# Patient Record
Sex: Male | Born: 2000 | Race: White | Hispanic: No | Marital: Single | Smoking: Never smoker
Health system: Southern US, Community
[De-identification: ages and names within clinical notes are randomized; demographics above are authoritative.]

---
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
## 2019-04-14 ENCOUNTER — Other Ambulatory Visit: Payer: Self-pay

## 2019-04-14 DIAGNOSIS — Z20822 Contact with and (suspected) exposure to covid-19: Secondary | ICD-10-CM

## 2019-04-15 LAB — NOVEL CORONAVIRUS, NAA: SARS-CoV-2, NAA: DETECTED — AB

## 2019-06-27 ENCOUNTER — Ambulatory Visit
Admission: RE | Admit: 2019-06-27 | Discharge: 2019-06-27 | Disposition: A | Discharge status: Home / Self Care | Payer: PRIVATE HEALTH INSURANCE | Source: Ambulatory Visit | Attending: Sports Medicine | Admitting: Sports Medicine

## 2019-06-27 ENCOUNTER — Other Ambulatory Visit: Payer: Self-pay | Admitting: Sports Medicine

## 2019-06-27 DIAGNOSIS — M546 Pain in thoracic spine: Secondary | ICD-10-CM | POA: Diagnosis not present

## 2019-07-25 ENCOUNTER — Ambulatory Visit: Payer: PRIVATE HEALTH INSURANCE | Attending: Internal Medicine

## 2019-07-25 DIAGNOSIS — Z23 Encounter for immunization: Secondary | ICD-10-CM

## 2019-07-25 NOTE — Progress Notes (Signed)
   Covid-19 Vaccination Clinic  Name:  CARMEN VALLECILLO    MRN: [id number] DOB: 01-08-2001  07/25/2019  Mr. Pro was observed post Covid-19 immunization for 15 minutes without incident. He was provided with Vaccine Information Sheet and instruction to access the V-Safe system.   Mr. Drost was instructed to call 911 with any severe reactions post vaccine: Marland Kitchen Difficulty breathing  . Swelling of face and throat  . A fast heartbeat  . A bad rash all over body  . Dizziness and weakness   Immunizations Administered    Name Date Dose VIS Date Route   Pfizer COVID-19 Vaccine 07/25/2019  4:21 PM 0.3 mL 04/11/2019 Intramuscular   Manufacturer: [employer], [employer]   Lot: [id number]   NDC: [address]-5

## 2019-08-15 ENCOUNTER — Ambulatory Visit: Payer: Self-pay

## 2019-08-22 ENCOUNTER — Ambulatory Visit: Payer: Self-pay

## 2019-08-22 ENCOUNTER — Ambulatory Visit: Payer: PRIVATE HEALTH INSURANCE | Attending: Internal Medicine

## 2019-08-22 DIAGNOSIS — Z23 Encounter for immunization: Secondary | ICD-10-CM

## 2019-08-22 NOTE — Progress Notes (Signed)
   Covid-19 Vaccination Clinic  Name:  Brian Keller    MRN: [id number] DOB: 2001-04-20  08/22/2019  [name] was observed post Covid-19 immunization for 15 minutes without incident. He was provided with Vaccine Information Sheet and instruction to access the V-Safe system.   [name] was instructed to call 911 with any severe reactions post vaccine: Marland Kitchen Difficulty breathing  . Swelling of face and throat  . A fast heartbeat  . A bad rash all over body  . Dizziness and weakness   Immunizations Administered    Name Date Dose VIS Date Route   Pfizer COVID-19 Vaccine 08/22/2019  5:41 PM 0.3 mL 06/25/2018 Intramuscular   Manufacturer: [employer], [employer]   Lot: [id number]   NDC: [address]-9

## 2022-02-24 IMAGING — CR DG SCOLIOSIS EVAL COMPLETE SPINE 2-3V
2 series · 8 of 8 positions shown · non-contrast
Comparison: No comparison available

CLINICAL DATA: 18-year-old male with a history of thoracic back
pain

EXAM:
DG SCOLIOSIS EVAL COMPLETE SPINE 2-3V

[Series 1: whole body ap · 0.14mm/px · 4 of 4 slices shown]
[im 1/4]
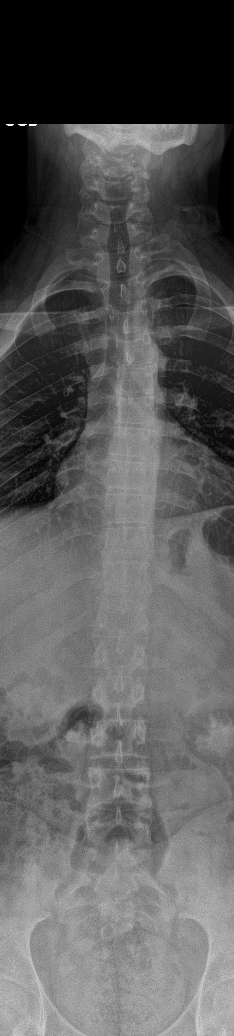
[im 2/4]
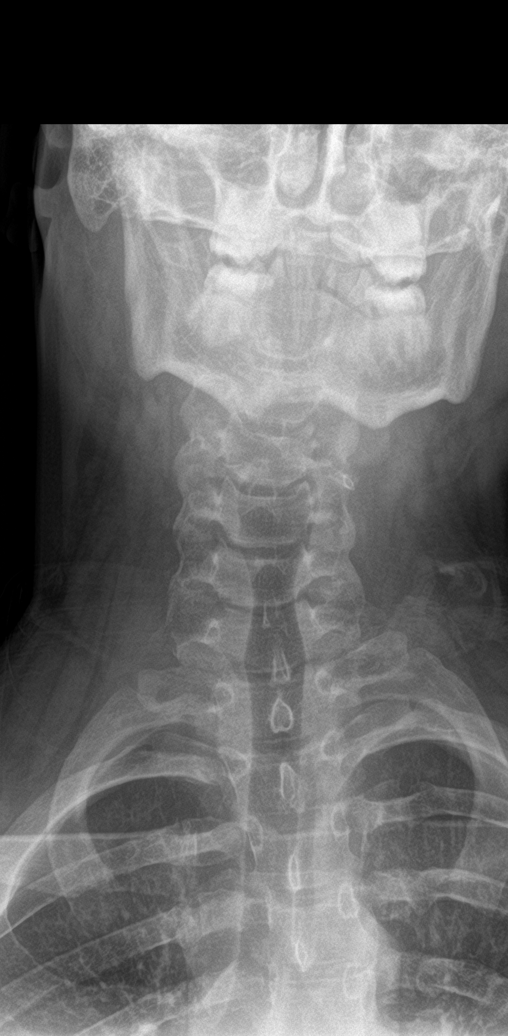
[im 3/4]
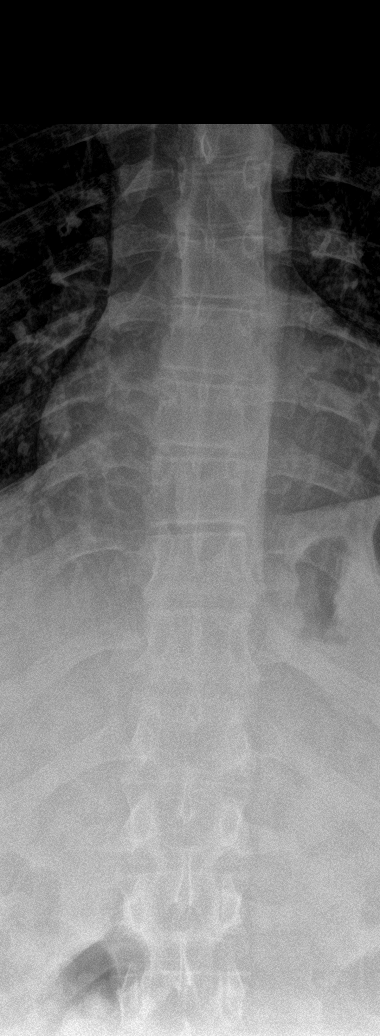
[im 4/4]
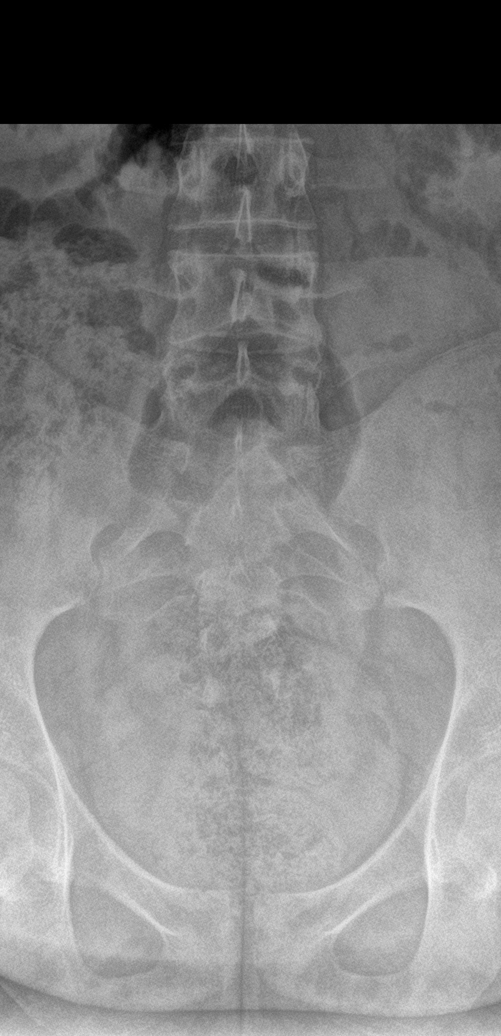

[Series 2: whole body lat · 0.14mm/px · 4 of 4 slices shown]
[im 1/4]
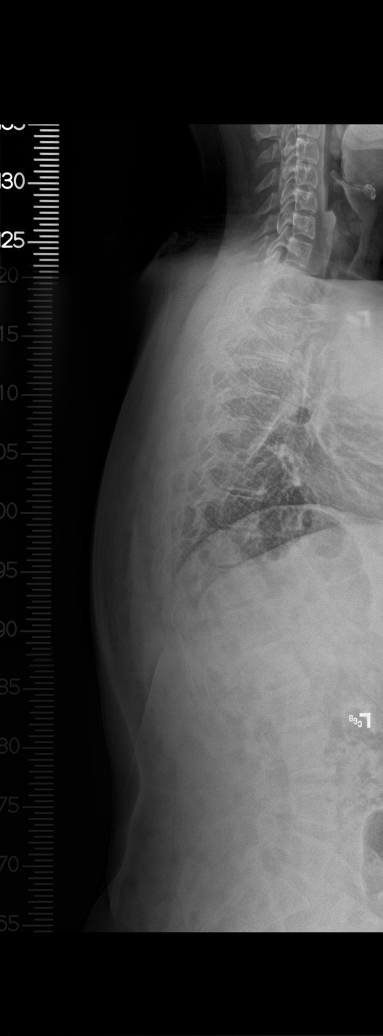
[im 2/4]
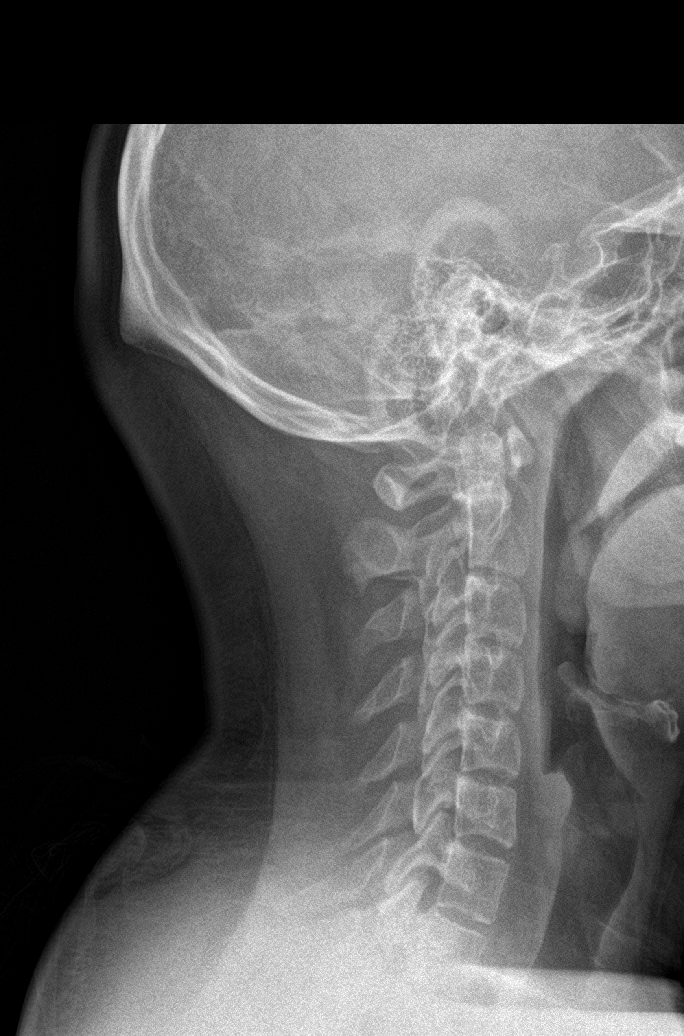
[im 3/4]
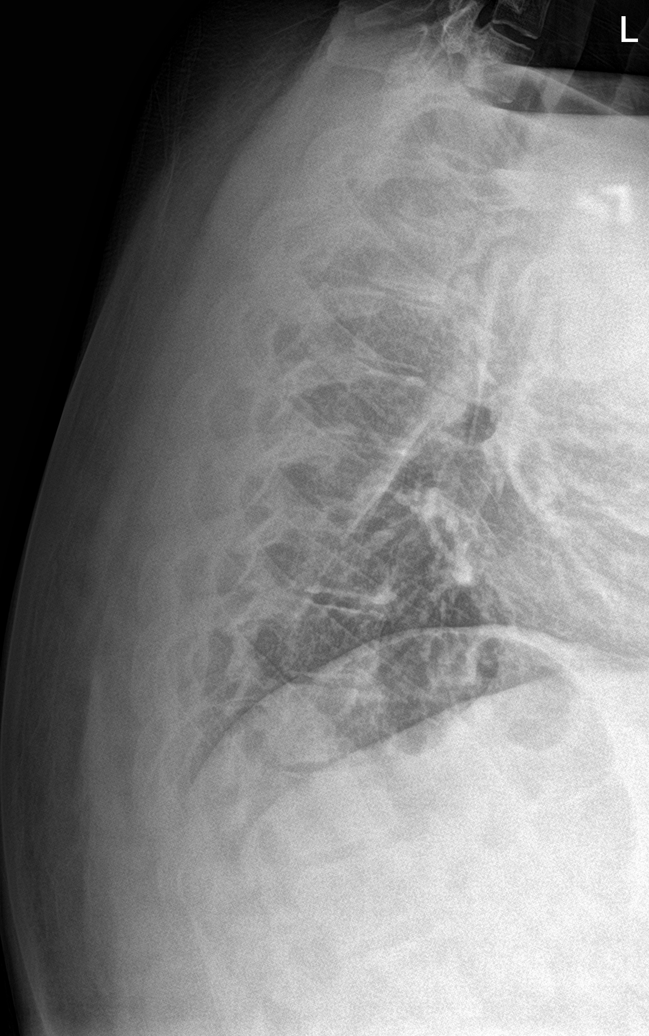
[im 4/4]
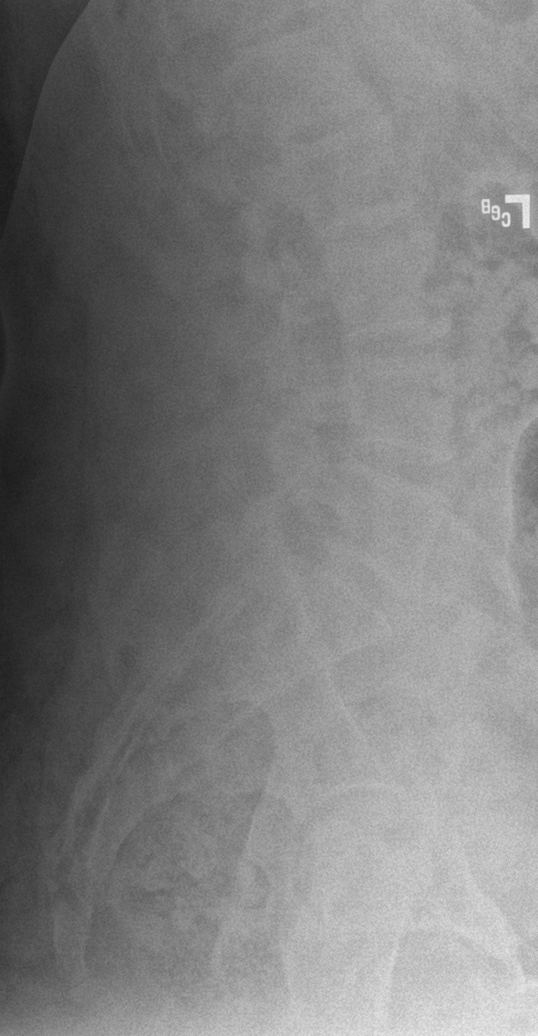

[8 of 8 positions shown; findings below may reference images not displayed]

FINDINGS: Cervical:

Anatomic alignment of the cervical vertebral bodies maintained.

Craniocervical junction maintained.

No acute fracture.

No significant degenerative changes.

Thoracic:

Twelve thoracic vertebral bodies identified with 12 rib pairs. No
segmentation anomaly.

No acute fracture.

16 degrees of dextroscoliosis measured from the superior endplate of
T2 to the inferior endplate of T7.

8 degrees of levoscoliosis measured from the superior endplate of T7
to the inferior endplate of T10.

Lumbar:

Five non rib-bearing lumbar type vertebral bodies identified.

No acute fracture.

No segmentation anomaly.

No significant degenerative changes.

No significant scoliotic curvature of the lumbar spine.
IMPRESSION: Dextroscoliosis of the thoracic spine, estimated 16 degrees from
T2-T7 as above.

No acute finding.
